# Patient Record
Sex: Male | Born: 2000 | Race: White | Hispanic: No | Marital: Single | Smoking: Never smoker
Health system: Southern US, Community
[De-identification: ages and names within clinical notes are randomized; demographics above are authoritative.]

## PROBLEM LIST (undated history)

## (undated) DIAGNOSIS — K219 Gastro-esophageal reflux disease without esophagitis: Secondary | ICD-10-CM

## (undated) DIAGNOSIS — T7840XA Allergy, unspecified, initial encounter: Secondary | ICD-10-CM

## (undated) DIAGNOSIS — R6250 Unspecified lack of expected normal physiological development in childhood: Secondary | ICD-10-CM

## (undated) DIAGNOSIS — B019 Varicella without complication: Secondary | ICD-10-CM

## (undated) DIAGNOSIS — E739 Lactose intolerance, unspecified: Secondary | ICD-10-CM

## (undated) HISTORY — DX: Gastro-esophageal reflux disease without esophagitis: K21.9

## (undated) HISTORY — DX: Allergy, unspecified, initial encounter: T78.40XA

## (undated) HISTORY — DX: Lactose intolerance, unspecified: E73.9

## (undated) HISTORY — DX: Unspecified lack of expected normal physiological development in childhood: R62.50

## (undated) HISTORY — DX: Varicella without complication: B01.9

---
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
## 2022-02-19 ENCOUNTER — Other Ambulatory Visit: Payer: Self-pay | Admitting: Family Medicine

## 2022-03-25 ENCOUNTER — Other Ambulatory Visit: Payer: Self-pay | Admitting: Family Medicine

## 2022-04-13 ENCOUNTER — Other Ambulatory Visit: Payer: Self-pay | Admitting: Family Medicine

## 2022-06-17 ENCOUNTER — Other Ambulatory Visit: Payer: Self-pay | Admitting: Family Medicine

## 2022-06-23 ENCOUNTER — Telehealth: Payer: Self-pay

## 2022-06-23 NOTE — Telephone Encounter (Signed)
Spoke with mother and advised her that if pt is out of medication to call office and we can send enough to make it to scheduled appt. Mom states that he said that he states he has enough for about 2 weeks.    [facility] Day - Client Nonclinical Telephone Record  [facility] Client [facility] Day - Client Client Site [facility] - Day Provider Claiborne Billings, [place] Contact Type Call Who Is Calling Patient / Member / Family / Caregiver Caller Name Chay Mazzoni Caller Phone Number [phone] Patient Name Jack Middleton Patient DOB May 21, 2001 Call Type Message Only Information Provided Reason for Call Request to Schedule Office Appointment Initial Comment Caller states son forgot prescription medication in campus, states has been out since may 2023, unsure if he needs new appt for refill/ Disp. Time Disposition Final User 06/17/2022 9:56:20 AM General Information Provided Yes Reina Fuse Call Closed By: Reina Fuse Transaction Date/Time: 06/17/2022 9:49:57 AM (ET)

## 2022-06-30 ENCOUNTER — Ambulatory Visit (INDEPENDENT_AMBULATORY_CARE_PROVIDER_SITE_OTHER): Payer: BC Managed Care – PPO | Admitting: Family Medicine

## 2022-06-30 ENCOUNTER — Encounter: Payer: Self-pay | Admitting: Family Medicine

## 2022-06-30 VITALS — BP 103/61 | HR 64 | Temp 97.5°F | Ht 67.0 in | Wt 123.0 lb

## 2022-06-30 DIAGNOSIS — F410 Panic disorder [episodic paroxysmal anxiety] without agoraphobia: Secondary | ICD-10-CM

## 2022-06-30 DIAGNOSIS — F419 Anxiety disorder, unspecified: Secondary | ICD-10-CM | POA: Diagnosis not present

## 2022-06-30 MED ORDER — ESCITALOPRAM OXALATE 10 MG PO TABS: 10.0000 mg | ORAL_TABLET | Freq: Every day | ORAL | 1 refills | Status: DC

## 2022-06-30 NOTE — Progress Notes (Signed)
Jack Middleton , 25-Aug-2001, 21 y.o., male MRN: [id number] Patient Care Team    Relationship Specialty Notifications Start End  Natalia Leatherwood, DO PCP - General Family Medicine  11/17/19     Chief Complaint  Patient presents with   Anxiety    [facility]; pt is not fasting     Subjective:  Jack Middleton is a 21 y.o. male presents today to discuss his anxiety.  Last visit lexapro 10 mg was started in place of zoloft. He did stop lexapro for awhile, but restarted about 6 weeks ago and feels it is working well again.   Prior note: He stopped taking zoloft on his own. He was tapered down to 25 mg qd, from 75 mg> which he felt made him numb> 50mg > which he eventually felt still made him numb> 25 mg which he did not feel was helpful enough. After he stopped meds he realized it was helping some at the 25 mg dose and attempted to restart at the full dose without tapering. He reports that made him sick and he vomited. He reports he has learned from all those mistakes and understands the importance to compliance with treatment. He would like to try another medication.   Prior note: Pt reports compliance with zoloft 75 mg qd initially- he reports he felt like it was causing him to not have any emotions. He tapered back to 1 tab a day and now he is taking 1/2 tab a a day (25 mg). He reports he is feeling well on this dose. He is going to college next month.  Prior note: with his mother to follow-up on anxiety.  He reports overall he is feels he is doing well on Zoloft 50 mg daily.  However he did notice since starting back to school this past week he has had an uptake in his anxiety. Prior note: He has started and tapered the Zoloft 50 mg daily.  He has tolerated the medication well.  He reports he feels much better since starting the medication.  He has noticed he is not as anxious and he is not worrying about things as much as he used to.  His mother reports she has also noticed a  considerable difference. Prior note:  Pt presents for an OV with complaints of anxiety. He is see today with his Mother which adds to history. He states he has noticed an  Increase in his anxiety over the last  Month since returning to in person classes. His grades are good. He does struggle some with Economics class, but overall grades are good. There has been no major changes in his classes or school, w/ the exception of pandemic changes. He reports feeling "overwhelmed", "worring about everything." He reports he gets about 8 hours of sleep a night, but has some issues falling asleep.  He and his mother state that he has had anxiety for about 10 years.  His sister also has anxiety and is on medication.  Mother states he is very hard on himself and wants to have good grades and stresses over school.  He is starting to go on college towards. Patient denies flushing, palpitations, diarrhea or unintentional weight loss     06/30/2022    1:51 PM 08/05/2021    2:40 PM 04/25/2021    3:52 PM 11/19/2020    3:47 PM 02/11/2020    3:30 PM  Depression screen PHQ 2/9  Decreased Interest 0 0 1 0 1  Down, Depressed, Hopeless 0 0 0 0 1  PHQ - 2 Score 0 0 1 0 2  Altered sleeping 0 2 0 0 3  Tired, decreased energy 1 0 2 3 3   Change in appetite 0 0 1 0 3  Feeling bad or failure about yourself  0 0 0 0 3  Trouble concentrating 0 1 0 0 3  Moving slowly or fidgety/restless 0 0 0 0 3  Suicidal thoughts 0 0 0 0 0  PHQ-9 Score 1 3 4 3 20   Difficult doing work/chores     Not difficult at all      06/30/2022    1:51 PM 08/05/2021    2:41 PM 04/25/2021    3:52 PM 11/19/2020    3:47 PM  GAD 7 : Generalized Anxiety Score  Nervous, Anxious, on Edge 0 3 0 1  Control/stop worrying 0 3 0 1  Worry too much - different things 0 3 0 0  Trouble relaxing 0 0 0 0  Restless 0 0 0 0  Easily annoyed or irritable 0 0 1 0  Afraid - awful might happen 0 0 0 0  Total GAD 7 Score 0 9 1 2     Allergies  Allergen Reactions    Dairy Aid [Tilactase]    Social History   Social History Narrative   Work or School: 06/26/2021 Situation: lives with mom, dad, sister (3 years older)   Safety:      -smoke alarm in the home:Yes     - wears seatbelt: Yes         Past Medical History:  Diagnosis Date   Allergy    Asthma 2016   Had been on Qvar, Singulair, regular and Flonase   Central auditory processing disorder 2014   [facility]   Chicken pox    Chronic gastritis without bleeding 10/07/2015   Concussion 2016   Dental decay 05/27/2015   GERD (gastroesophageal reflux disease) 05/15/2016   Lactose intolerance    Osgood-Schlatter's disease of both knees 06/11/2017   Physical growth delay 05/15/2016   - sees endocrinologist at [facility].  Had been prescribed testosterone at one time.   History reviewed. No pertinent surgical history. Family History  Problem Relation Age of Onset   Hypothyroidism Mother    Hyperlipidemia Father    Hypothyroidism Sister    Hypertension Maternal Grandfather    Heart attack Maternal Grandfather    Heart attack Paternal Grandmother    Heart disease Paternal Grandmother    Heart attack Paternal Grandfather    Heart disease Paternal Grandfather    Hyperlipidemia Paternal Grandfather    Allergies as of 06/30/2022       Reactions   Dairy Aid [tilactase]         Medication List        Accurate as of June 30, 2022  2:02 PM. If you have any questions, ask your nurse or doctor.          cholecalciferol 25 MCG (1000 UNIT) tablet Commonly known as: VITAMIN D3 Take 1,000 Units by mouth daily.   escitalopram 10 MG tablet Commonly known as: LEXAPRO Take 1 tablet (10 mg total) by mouth daily.   fexofenadine 180 MG tablet Commonly known as: ALLEGRA Take 180 mg by mouth daily.   vitamin C 1000 MG tablet Take 1,000 mg by mouth daily.        All past medical history, surgical history, allergies,  family history, immunizations andmedications were updated in the EMR today and reviewed under the history and medication portions of their EMR.     ROS: Negative, with the exception of above mentioned in HPI   Objective:  BP 103/61   Pulse 64   Temp (!) 97.5 F (36.4 C) (Oral)   Ht 5\' 7"  (1.702 m)   Wt 123 lb (55.8 kg)   SpO2 97%   BMI 19.26 kg/m  Body mass index is 19.26 kg/m. Physical Exam Vitals and nursing note reviewed.  Constitutional:      General: He is not in acute distress.    Appearance: Normal appearance. He is not ill-appearing, toxic-appearing or diaphoretic.  HENT:     Head: Normocephalic and atraumatic.  Eyes:     General: No scleral icterus.       Right eye: No discharge.        Left eye: No discharge.     Extraocular Movements: Extraocular movements intact.     Pupils: Pupils are equal, round, and reactive to light.  Skin:    General: Skin is warm and dry.     Coloration: Skin is not jaundiced or pale.     Findings: No rash.  Neurological:     Mental Status: He is alert and oriented to person, place, and time. Mental status is at baseline.  Psychiatric:        Mood and Affect: Mood normal.        Behavior: Behavior normal.        Thought Content: Thought content normal.        Judgment: Judgment normal.      No results found. No results found. No results found for this or any previous visit (from the past 24 hour(s)).  Assessment/Plan: Jack Middleton is a 21 y.o. male present for OV for  GAD/panic: stable Continue lexapro 10 mg.  - Ambulatory referral to Psychology was placed at initial visit - f/u 5.5 mos on chronic conditions, sooner if needed   Reviewed expectations re: course of current medical issues. Discussed self-management of symptoms. Outlined signs and symptoms indicating need for more acute intervention. Patient verbalized understanding and all questions were answered. Patient received an After-Visit Summary.    No  orders of the defined types were placed in this encounter.   Meds ordered this encounter  Medications   escitalopram (LEXAPRO) 10 MG tablet    Sig: Take 1 tablet (10 mg total) by mouth daily.    Dispense:  90 tablet    Refill:  1     Referral Orders  No referral(s) requested today     Note is dictated utilizing voice recognition software. Although note has been proof read prior to signing, occasional typographical errors still can be missed. If any questions arise, please do not hesitate to call for verification.   electronically signed by:  12, DO  [facility] [place]

## 2022-06-30 NOTE — Patient Instructions (Addendum)
Return in about 24 weeks (around 12/15/2022) for Routine chronic condition follow-up.        Great to see you today.  I have refilled the medication(s) we provide.   If labs were collected, we will inform you of lab results once received either by echart message or telephone call.   - echart message- for normal results that have been seen by the patient already.   - telephone call: abnormal results or if patient has not viewed results in their echart.

## 2022-11-29 DIAGNOSIS — F4322 Adjustment disorder with anxiety: Secondary | ICD-10-CM | POA: Diagnosis not present

## 2022-12-06 DIAGNOSIS — F4322 Adjustment disorder with anxiety: Secondary | ICD-10-CM | POA: Diagnosis not present

## 2022-12-08 DIAGNOSIS — F4322 Adjustment disorder with anxiety: Secondary | ICD-10-CM | POA: Diagnosis not present

## 2022-12-13 DIAGNOSIS — F4322 Adjustment disorder with anxiety: Secondary | ICD-10-CM | POA: Diagnosis not present

## 2022-12-15 ENCOUNTER — Ambulatory Visit: Payer: BC Managed Care – PPO | Admitting: Family Medicine

## 2022-12-20 DIAGNOSIS — F4322 Adjustment disorder with anxiety: Secondary | ICD-10-CM | POA: Diagnosis not present

## 2022-12-26 ENCOUNTER — Ambulatory Visit (INDEPENDENT_AMBULATORY_CARE_PROVIDER_SITE_OTHER): Payer: BC Managed Care – PPO | Admitting: Family Medicine

## 2022-12-26 ENCOUNTER — Encounter: Payer: Self-pay | Admitting: Family Medicine

## 2022-12-26 VITALS — BP 111/70 | HR 43 | Wt 127.4 lb

## 2022-12-26 DIAGNOSIS — F419 Anxiety disorder, unspecified: Secondary | ICD-10-CM | POA: Diagnosis not present

## 2022-12-26 DIAGNOSIS — F410 Panic disorder [episodic paroxysmal anxiety] without agoraphobia: Secondary | ICD-10-CM

## 2022-12-26 MED ORDER — ESCITALOPRAM OXALATE 20 MG PO TABS: 20.0000 mg | ORAL_TABLET | Freq: Every day | ORAL | 1 refills | Status: DC

## 2022-12-26 NOTE — Patient Instructions (Addendum)
Return in about 24 weeks (around 06/12/2023) for Routine chronic condition follow-up.        Great to see you today.  I have refilled the medication(s) we provide.   If labs were collected, we will inform you of lab results once received either by echart message or telephone call.   - echart message- for normal results that have been seen by the patient already.   - telephone call: abnormal results or if patient has not viewed results in their echart.

## 2022-12-26 NOTE — Progress Notes (Signed)
Jack Middleton , Jun 05, 2001, 22 y.o., male MRN: [id number] Patient Care Team    Relationship Specialty Notifications Start End  Ma Hillock, DO PCP - General Family Medicine  11/17/19     Chief Complaint  Patient presents with   Anxiety    [facility]     Subjective:  Jack Middleton is a 22 y.o. male presents today to discuss his anxiety.  He reports tolerance of lexapro 10 mg qd. He is still having a good bit of anxiety. He has been exercising and training for marathon.    Prior note: He stopped taking zoloft on his own. He was tapered down to 25 mg qd, from 75 mg> which he felt made him numb> '50mg'$ > which he eventually felt still made him numb> 25 mg which he did not feel was helpful enough. After he stopped meds he realized it was helping some at the 25 mg dose and attempted to restart at the full dose without tapering. He reports that made him sick and he vomited. He reports he has learned from all those mistakes and understands the importance to compliance with treatment. He would like to try another medication.   Prior note: Pt reports compliance with zoloft 75 mg qd initially- he reports he felt like it was causing him to not have any emotions. He tapered back to 1 tab a day and now he is taking 1/2 tab a a day (25 mg). He reports he is feeling well on this dose. He is going to college next month.  Prior note: with his mother to follow-up on anxiety.  He reports overall he is feels he is doing well on Zoloft 50 mg daily.  However he did notice since starting back to school this past week he has had an uptake in his anxiety. Prior note: He has started and tapered the Zoloft 50 mg daily.  He has tolerated the medication well.  He reports he feels much better since starting the medication.  He has noticed he is not as anxious and he is not worrying about things as much as he used to.  His mother reports she has also noticed a considerable difference. Prior note:  Pt presents  for an OV with complaints of anxiety. He is see today with his Mother which adds to history. He states he has noticed an  Increase in his anxiety over the last  Month since returning to in person classes. His grades are good. He does struggle some with Economics class, but overall grades are good. There has been no major changes in his classes or school, w/ the exception of pandemic changes. He reports feeling "overwhelmed", "worring about everything." He reports he gets about 8 hours of sleep a night, but has some issues falling asleep.  He and his mother state that he has had anxiety for about 10 years.  His sister also has anxiety and is on medication.  Mother states he is very hard on himself and wants to have good grades and stresses over school.  He is starting to go on college towards. Patient denies flushing, palpitations, diarrhea or unintentional weight loss     12/26/2022   10:02 AM 06/30/2022    1:51 PM 08/05/2021    2:40 PM 04/25/2021    3:52 PM 11/19/2020    3:47 PM  Depression screen PHQ 2/9  Decreased Interest 0 0 0 1 0  Down, Depressed, Hopeless 0 0 0 0 0  PHQ - 2 Score 0 0 0 1 0  Altered sleeping  0 2 0 0  Tired, decreased energy  1 0 2 3  Change in appetite  0 0 1 0  Feeling bad or failure about yourself   0 0 0 0  Trouble concentrating  0 1 0 0  Moving slowly or fidgety/restless  0 0 0 0  Suicidal thoughts  0 0 0 0  PHQ-9 Score  '1 3 4 3      '$ 12/26/2022   10:02 AM 06/30/2022    1:51 PM 08/05/2021    2:41 PM 04/25/2021    3:52 PM  GAD 7 : Generalized Anxiety Score  Nervous, Anxious, on Edge 0 0 3 0  Control/stop worrying 0 0 3 0  Worry too much - different things 0 0 3 0  Trouble relaxing 0 0 0 0  Restless 0 0 0 0  Easily annoyed or irritable 0 0 0 1  Afraid - awful might happen 0 0 0 0  Total GAD 7 Score 0 0 9 1    Allergies  Allergen Reactions   Dairy Aid [Tilactase]    Social History   Social History Narrative   Work or School: Scientist, product/process development Situation: lives with mom, dad, sister (72 years older)   Safety:      -smoke alarm in the home:Yes     - wears seatbelt: Yes         Past Medical History:  Diagnosis Date   Allergy    Asthma 2016   Had been on Qvar, Singulair, regular and Flonase   Central auditory processing disorder 2014   Diagnosed by [facility]   Chicken pox    Chronic gastritis without bleeding 10/07/2015   Concussion 2016   Dental decay 05/27/2015   GERD (gastroesophageal reflux disease) 05/15/2016   Lactose intolerance    Osgood-Schlatter's disease of both knees 06/11/2017   Physical growth delay 05/15/2016   - sees endocrinologist at [facility].  Had been prescribed testosterone at one time.   History reviewed. No pertinent surgical history. Family History  Problem Relation Age of Onset   Hypothyroidism Mother    Hyperlipidemia Father    Hypothyroidism Sister    Hypertension Maternal Grandfather    Heart attack Maternal Grandfather    Heart attack Paternal Grandmother    Heart disease Paternal Grandmother    Heart attack Paternal Grandfather    Heart disease Paternal Grandfather    Hyperlipidemia Paternal Grandfather    Allergies as of 12/26/2022       Reactions   Dairy Aid [tilactase]         Medication List        Accurate as of December 26, 2022 10:15 AM. If you have any questions, ask your nurse or doctor.          cholecalciferol 25 MCG (1000 UNIT) tablet Commonly known as: VITAMIN D3 Take 1,000 Units by mouth daily.   escitalopram 20 MG tablet Commonly known as: LEXAPRO Take 1 tablet (20 mg total) by mouth daily. What changed:  medication strength how much to take Changed by: Howard Pouch, DO   fexofenadine 180 MG tablet Commonly known as: ALLEGRA Take 180 mg by mouth daily.   vitamin C 1000 MG tablet Take 1,000 mg by mouth daily.        All past medical history, surgical history, allergies, family history, immunizations  andmedications were updated in the  EMR today and reviewed under the history and medication portions of their EMR.     ROS: Negative, with the exception of above mentioned in HPI   Objective:  BP 111/70   Pulse (!) 43   Wt 127 lb 6.4 oz (57.8 kg)   SpO2 100%   BMI 19.95 kg/m  Body mass index is 19.95 kg/m. Physical Exam Vitals and nursing note reviewed.  Constitutional:      General: He is not in acute distress.    Appearance: Normal appearance. He is not ill-appearing, toxic-appearing or diaphoretic.  HENT:     Head: Normocephalic and atraumatic.  Eyes:     General: No scleral icterus.       Right eye: No discharge.        Left eye: No discharge.     Extraocular Movements: Extraocular movements intact.     Pupils: Pupils are equal, round, and reactive to light.  Skin:    General: Skin is warm and dry.     Coloration: Skin is not jaundiced or pale.     Findings: No rash.  Neurological:     Mental Status: He is alert and oriented to person, place, and time. Mental status is at baseline.  Psychiatric:        Mood and Affect: Mood normal.        Behavior: Behavior normal.        Thought Content: Thought content normal.        Judgment: Judgment normal.      No results found. No results found. No results found for this or any previous visit (from the past 24 hour(s)).  Assessment/Plan: Jack Middleton is a 22 y.o. male present for OV for  GAD/panic: Increased anxiety Increase lexapro 10 mg.> 20 mg  - He is now seeing a therapist routinely and feels it is a good fit.  - f/u 5.5 mos on chronic conditions, sooner if needed   Reviewed expectations re: course of current medical issues. Discussed self-management of symptoms. Outlined signs and symptoms indicating need for more acute intervention. Patient verbalized understanding and all questions were answered. Patient received an After-Visit Summary.    No orders of the defined types were placed in this  encounter.   Meds ordered this encounter  Medications   escitalopram (LEXAPRO) 20 MG tablet    Sig: Take 1 tablet (20 mg total) by mouth daily.    Dispense:  90 tablet    Refill:  1     Referral Orders  No referral(s) requested today     Note is dictated utilizing voice recognition software. Although note has been proof read prior to signing, occasional typographical errors still can be missed. If any questions arise, please do not hesitate to call for verification.   electronically signed by:  Howard Pouch, DO  [place]

## 2022-12-27 DIAGNOSIS — F4322 Adjustment disorder with anxiety: Secondary | ICD-10-CM | POA: Diagnosis not present

## 2023-01-03 DIAGNOSIS — F4322 Adjustment disorder with anxiety: Secondary | ICD-10-CM | POA: Diagnosis not present

## 2023-01-10 DIAGNOSIS — F4322 Adjustment disorder with anxiety: Secondary | ICD-10-CM | POA: Diagnosis not present

## 2023-01-17 DIAGNOSIS — F4322 Adjustment disorder with anxiety: Secondary | ICD-10-CM | POA: Diagnosis not present

## 2023-01-24 DIAGNOSIS — F4322 Adjustment disorder with anxiety: Secondary | ICD-10-CM | POA: Diagnosis not present

## 2023-02-09 DIAGNOSIS — F4322 Adjustment disorder with anxiety: Secondary | ICD-10-CM | POA: Diagnosis not present

## 2023-03-01 DIAGNOSIS — F4322 Adjustment disorder with anxiety: Secondary | ICD-10-CM | POA: Diagnosis not present

## 2023-03-21 DIAGNOSIS — F4322 Adjustment disorder with anxiety: Secondary | ICD-10-CM | POA: Diagnosis not present

## 2023-04-06 DIAGNOSIS — F4322 Adjustment disorder with anxiety: Secondary | ICD-10-CM | POA: Diagnosis not present

## 2023-05-03 DIAGNOSIS — F4322 Adjustment disorder with anxiety: Secondary | ICD-10-CM | POA: Diagnosis not present

## 2023-06-06 DIAGNOSIS — F4322 Adjustment disorder with anxiety: Secondary | ICD-10-CM | POA: Diagnosis not present

## 2023-06-12 ENCOUNTER — Telehealth: Payer: BC Managed Care – PPO | Admitting: Family Medicine

## 2023-06-15 ENCOUNTER — Telehealth: Payer: BC Managed Care – PPO | Admitting: Family Medicine

## 2023-06-24 ENCOUNTER — Other Ambulatory Visit: Payer: Self-pay | Admitting: Family Medicine

## 2023-06-29 ENCOUNTER — Encounter: Payer: Self-pay | Admitting: Family Medicine

## 2023-06-29 ENCOUNTER — Telehealth (INDEPENDENT_AMBULATORY_CARE_PROVIDER_SITE_OTHER): Payer: BC Managed Care – PPO | Admitting: Family Medicine

## 2023-06-29 DIAGNOSIS — F419 Anxiety disorder, unspecified: Secondary | ICD-10-CM | POA: Diagnosis not present

## 2023-06-29 DIAGNOSIS — F410 Panic disorder [episodic paroxysmal anxiety] without agoraphobia: Secondary | ICD-10-CM | POA: Diagnosis not present

## 2023-06-29 DIAGNOSIS — F4322 Adjustment disorder with anxiety: Secondary | ICD-10-CM | POA: Diagnosis not present

## 2023-06-29 MED ORDER — ESCITALOPRAM OXALATE 20 MG PO TABS: 20.0000 mg | ORAL_TABLET | Freq: Every day | ORAL | 1 refills | Status: DC

## 2023-06-29 NOTE — Progress Notes (Signed)
VIRTUAL VISIT VIA VIDEO  I connected with Jack Middleton on 06/29/23 at  2:40 PM EDT by a video enabled telemedicine application and verified that I am speaking with the correct person using two identifiers. Location patient: Home Location provider: [facility], Office Persons participating in the virtual visit: Patient, Dr. Claiborne Billings and Ivonne Andrew, CMA  I discussed the limitations of evaluation and management by telemedicine and the availability of in person appointments. The patient expressed understanding and agreed to proceed.      Jack Middleton , 09/03/2001, 22 y.o., male MRN: [id number] Patient Care Team    Relationship Specialty Notifications Start End  Natalia Leatherwood, DO PCP - General Family Medicine  11/17/19     Chief Complaint  Patient presents with   Medical Management of Chronic Issues     Subjective:  Jack Middleton is a 22 y.o. male present Chronic Conditions/illness Management today  Anxiety/panic: He reports compliance with  lexapro 20 mg every day, which was increased last visit. He reports he feels well on current dose. He has noticed a decrease in libido since increasing the dose.   Prior note: He stopped taking zoloft on his own. He was tapered down to 25 mg qd, from 75 mg> which he felt made him numb> 50mg > which he eventually felt still made him numb> 25 mg which he did not feel was helpful enough. After he stopped meds he realized it was helping some at the 25 mg dose and attempted to restart at the full dose without tapering. He reports that made him sick and he vomited. He reports he has learned from all those mistakes and understands the importance to compliance with treatment. He would like to try another medication.   Prior note: Pt reports compliance with zoloft 75 mg qd initially- he reports he felt like it was causing him to not have any emotions. He tapered back to 1 tab a day and now he is taking 1/2 tab a a day (25 mg). He  reports he is feeling well on this dose. He is going to college next month.  Prior note: with his mother to follow-up on anxiety.  He reports overall he is feels he is doing well on Zoloft 50 mg daily.  However he did notice since starting back to school this past week he has had an uptake in his anxiety. Prior note: He has started and tapered the Zoloft 50 mg daily.  He has tolerated the medication well.  He reports he feels much better since starting the medication.  He has noticed he is not as anxious and he is not worrying about things as much as he used to.  His mother reports she has also noticed a considerable difference. Prior note:  Pt presents for an OV with complaints of anxiety. He is see today with his Mother which adds to history. He states he has noticed an  Increase in his anxiety over the last  Month since returning to in person classes. His grades are good. He does struggle some with Economics class, but overall grades are good. There has been no major changes in his classes or school, w/ the exception of pandemic changes. He reports feeling "overwhelmed", "worring about everything." He reports he gets about 8 hours of sleep a night, but has some issues falling asleep.  He and his mother state that he has had anxiety for about 10 years.  His sister also has  anxiety and is on medication.  Mother states he is very hard on himself and wants to have good grades and stresses over school.  He is starting to go on college towards. Patient denies flushing, palpitations, diarrhea or unintentional weight loss     06/29/2023    2:04 PM 12/26/2022   10:02 AM 06/30/2022    1:51 PM 08/05/2021    2:40 PM 04/25/2021    3:52 PM  Depression screen PHQ 2/9  Decreased Interest 0 0 0 0 1  Down, Depressed, Hopeless 1 0 0 0 0  PHQ - 2 Score 1 0 0 0 1  Altered sleeping 0  0 2 0  Tired, decreased energy 1  1 0 2  Change in appetite 0  0 0 1  Feeling bad or failure about yourself  0  0 0 0  Trouble  concentrating 0  0 1 0  Moving slowly or fidgety/restless 0  0 0 0  Suicidal thoughts 0  0 0 0  PHQ-9 Score 2  1 3 4   Difficult doing work/chores Not difficult at all          06/29/2023    2:05 PM 12/26/2022   10:02 AM 06/30/2022    1:51 PM 08/05/2021    2:41 PM  GAD 7 : Generalized Anxiety Score  Nervous, Anxious, on Edge 1 0 0 3  Control/stop worrying 1 0 0 3  Worry too much - different things 1 0 0 3  Trouble relaxing 0 0 0 0  Restless 0 0 0 0  Easily annoyed or irritable 0 0 0 0  Afraid - awful might happen 0 0 0 0  Total GAD 7 Score 3 0 0 9  Anxiety Difficulty Not difficult at all       Allergies  Allergen Reactions   Dairy Aid [Tilactase]    Social History   Social History Narrative   Work or School: Sports coach Situation: lives with mom, dad, sister (3 years older)   Safety:      -smoke alarm in the home:Yes     - wears seatbelt: Yes         Past Medical History:  Diagnosis Date   Allergy    Asthma 2016   Had been on Qvar, Singulair, regular and Flonase   Central auditory processing disorder 2014   [facility]   Chicken pox    Chronic gastritis without bleeding 10/07/2015   Concussion 2016   Dental decay 05/27/2015   GERD (gastroesophageal reflux disease) 05/15/2016   Lactose intolerance    Osgood-Schlatter's disease of both knees 06/11/2017   Physical growth delay 05/15/2016   - sees endocrinologist at [facility].  Had been prescribed testosterone at one time.   History reviewed. No pertinent surgical history. Family History  Problem Relation Age of Onset   Hypothyroidism Mother    Hyperlipidemia Father    Hypothyroidism Sister    Hypertension Maternal Grandfather    Heart attack Maternal Grandfather    Heart attack Paternal Grandmother    Heart disease Paternal Grandmother    Heart attack Paternal Grandfather    Heart disease Paternal Grandfather    Hyperlipidemia Paternal  Grandfather    Allergies as of 06/29/2023       Reactions   Dairy Aid [tilactase]         Medication List        Accurate as of June 29, 2023  2:08  PM. If you have any questions, ask your nurse or doctor.          cholecalciferol 25 MCG (1000 UNIT) tablet Commonly known as: VITAMIN D3 Take 1,000 Units by mouth daily.   escitalopram 20 MG tablet Commonly known as: LEXAPRO Take 1 tablet (20 mg total) by mouth daily.   fexofenadine 180 MG tablet Commonly known as: ALLEGRA Take 180 mg by mouth daily.   vitamin C 1000 MG tablet Take 1,000 mg by mouth daily.        All past medical history, surgical history, allergies, family history, immunizations andmedications were updated in the EMR today and reviewed under the history and medication portions of their EMR.     ROS: Negative, with the exception of above mentioned in HPI   Objective:  There were no vitals taken for this visit. There is no height or weight on file to calculate BMI. Physical Exam Vitals and nursing note reviewed.  Constitutional:      General: He is not in acute distress.    Appearance: Normal appearance. He is not toxic-appearing.  HENT:     Head: Normocephalic and atraumatic.  Eyes:     General: No scleral icterus.       Right eye: No discharge.        Left eye: No discharge.     Conjunctiva/sclera: Conjunctivae normal.  Pulmonary:     Effort: Pulmonary effort is normal.  Musculoskeletal:     Cervical back: Normal range of motion.  Skin:    Findings: No rash.  Neurological:     Mental Status: He is alert and oriented to person, place, and time. Mental status is at baseline.  Psychiatric:        Mood and Affect: Mood normal.        Behavior: Behavior normal.        Thought Content: Thought content normal.        Judgment: Judgment normal.    No results found. No results found. No results found for this or any previous visit (from the past 24  hour(s)).  Assessment/Plan: Bikram Zubal is a 22 y.o. male present for OV for  GAD/panic: Stable. He is endorsing decrease in libido, but would like to stick with current dose for now. We also discussed Wellbutrin if this SE becomes an issue for him. Hopefully SE will decrease with time.  - lexapro 20 mg  - He is now seeing a therapist routinely and feels it is a good fit.  - f/u 5.5 mos on chronic conditions sooner if needed   Reviewed expectations re: course of current medical issues. Discussed self-management of symptoms. Outlined signs and symptoms indicating need for more acute intervention. Patient verbalized understanding and all questions were answered. Patient received an After-Visit Summary.    No orders of the defined types were placed in this encounter.   No orders of the defined types were placed in this encounter.    Referral Orders  No referral(s) requested today     Note is dictated utilizing voice recognition software. Although note has been proof read prior to signing, occasional typographical errors still can be missed. If any questions arise, please do not hesitate to call for verification.   electronically signed by:  Felix Pacini, DO  [facility] [place]

## 2023-06-29 NOTE — Patient Instructions (Signed)

## 2023-12-28 ENCOUNTER — Other Ambulatory Visit: Payer: Self-pay | Admitting: Family Medicine

## 2024-01-31 ENCOUNTER — Other Ambulatory Visit: Payer: Self-pay | Admitting: Family Medicine

## 2024-02-03 ENCOUNTER — Other Ambulatory Visit: Payer: Self-pay | Admitting: Family Medicine

## 2024-03-18 ENCOUNTER — Ambulatory Visit (INDEPENDENT_AMBULATORY_CARE_PROVIDER_SITE_OTHER): Admitting: Family Medicine

## 2024-03-18 DIAGNOSIS — Z91199 Patient's noncompliance with other medical treatment and regimen due to unspecified reason: Secondary | ICD-10-CM

## 2024-03-18 NOTE — Progress Notes (Signed)
    No show/cancel after appt time

## 2024-03-20 ENCOUNTER — Telehealth (INDEPENDENT_AMBULATORY_CARE_PROVIDER_SITE_OTHER): Admitting: Family Medicine

## 2024-03-20 ENCOUNTER — Encounter: Payer: Self-pay | Admitting: Family Medicine

## 2024-03-20 DIAGNOSIS — F419 Anxiety disorder, unspecified: Secondary | ICD-10-CM

## 2024-03-20 DIAGNOSIS — F410 Panic disorder [episodic paroxysmal anxiety] without agoraphobia: Secondary | ICD-10-CM | POA: Diagnosis not present

## 2024-03-20 MED ORDER — SERTRALINE HCL 25 MG PO TABS: 25.0000 mg | ORAL_TABLET | Freq: Every day | ORAL | 3 refills | Status: AC

## 2024-03-20 NOTE — Progress Notes (Signed)
 VIRTUAL VISIT VIA VIDEO  I connected with Jack Middleton on 03/20/24 at  9:20 AM EDT by a video enabled telemedicine application and verified that I am speaking with the correct person using two identifiers. Location patient: Home Location provider: [facility], Office Persons participating in the virtual visit: Patient, Dr. Marylee Snowball and Jack Middleton, CMA  I discussed the limitations of evaluation and management by telemedicine and the availability of in person appointments. The patient expressed understanding and agreed to proceed.     Jack Middleton , Nov 04, 2000, 23 y.o., male MRN: [id number] Patient Care Team    Relationship Specialty Notifications Start End  Mariel Shope, DO PCP - General Family Medicine  11/17/19     Chief Complaint  Patient presents with   Anxiety    Pt wants to discuss getting off of Lexapro  completely. Pt has not been taking med.      Subjective:  Jack Middleton is a 23 y.o. male present Chronic Conditions/illness Management today  Anxiety/panic: He reports he stopped the lexapro  20 mg every day. He felt the side effects became too much. He experienced decreased libido, and then went on to developing nausea and diarrhea. He is moving to [place]  in about a month. He graduated this month. He would like to restart a medication for anxiety.   Prior note: He stopped taking zoloft  on his own. He was tapered down to 25 mg qd, from 75 mg> which he felt made him numb> 50mg > which he eventually felt still made him numb> 25 mg which he did not feel was helpful enough. After he stopped meds he realized it was helping some at the 25 mg dose and attempted to restart at the full dose without tapering. He reports that made him sick and he vomited. He reports he has learned from all those mistakes and understands the importance to compliance with treatment. He would like to try another medication.   Prior note: Pt reports compliance with zoloft  75 mg qd  initially- he reports he felt like it was causing him to not have any emotions. He tapered back to 1 tab a day and now he is taking 1/2 tab a a day (25 mg). He reports he is feeling well on this dose. He is going to college next month.  Prior note: with his mother to follow-up on anxiety.  He reports overall he is feels he is doing well on Zoloft  50 mg daily.  However he did notice since starting back to school this past week he has had an uptake in his anxiety. Prior note: He has started and tapered the Zoloft  50 mg daily.  He has tolerated the medication well.  He reports he feels much better since starting the medication.  He has noticed he is not as anxious and he is not worrying about things as much as he used to.  His mother reports she has also noticed a considerable difference. Prior note:  Pt presents for an OV with complaints of anxiety. He is see today with his Mother which adds to history. He states he has noticed an  Increase in his anxiety over the last  Month since returning to in person classes. His grades are good. He does struggle some with Economics class, but overall grades are good. There has been no major changes in his classes or school, w/ the exception of pandemic changes. He reports feeling "overwhelmed", "worring about everything." He reports he gets  about 8 hours of sleep a night, but has some issues falling asleep.  He and his mother state that he has had anxiety for about 10 years.  His sister also has anxiety and is on medication.  Mother states he is very hard on himself and wants to have good grades and stresses over school.  He is starting to go on college towards. Patient denies flushing, palpitations, diarrhea or unintentional weight loss     03/20/2024    8:58 AM 06/29/2023    2:04 PM 12/26/2022   10:02 AM 06/30/2022    1:51 PM 08/05/2021    2:40 PM  Depression screen PHQ 2/9  Decreased Interest 0 0 0 0 0  Down, Depressed, Hopeless 0 1 0 0 0  PHQ - 2 Score 0 1 0 0 0   Altered sleeping  0  0 2  Tired, decreased energy  1  1 0  Change in appetite  0  0 0  Feeling bad or failure about yourself   0  0 0  Trouble concentrating  0  0 1  Moving slowly or fidgety/restless  0  0 0  Suicidal thoughts  0  0 0  PHQ-9 Score  2  1 3   Difficult doing work/chores  Not difficult at all         06/29/2023    2:05 PM 12/26/2022   10:02 AM 06/30/2022    1:51 PM 08/05/2021    2:41 PM  GAD 7 : Generalized Anxiety Score  Nervous, Anxious, on Edge 1 0 0 3  Control/stop worrying 1 0 0 3  Worry too much - different things 1 0 0 3  Trouble relaxing 0 0 0 0  Restless 0 0 0 0  Easily annoyed or irritable 0 0 0 0  Afraid - awful might happen 0 0 0 0  Total GAD 7 Score 3 0 0 9  Anxiety Difficulty Not difficult at all       Allergies  Allergen Reactions   Dairy Aid [Tilactase]    Social History   Social History Narrative   Work or School: Sports coach Situation: lives with mom, dad, sister (3 years older)   Safety:      -smoke alarm in the home:Yes     - wears seatbelt: Yes         Past Medical History:  Diagnosis Date   Allergy    Asthma 2016   Had been on Qvar, Singulair, regular and Flonase   Central auditory processing disorder 2014   [facility]   Chicken pox    Chronic gastritis without bleeding 10/07/2015   Concussion 2016   Dental decay 05/27/2015   GERD (gastroesophageal reflux disease) 05/15/2016   Lactose intolerance    Osgood-Schlatter's disease of both knees 06/11/2017   Physical growth delay 05/15/2016   - sees endocrinologist at [facility].  Had been prescribed testosterone  at one time.   History reviewed. No pertinent surgical history. Family History  Problem Relation Age of Onset   Hypothyroidism Mother    Hyperlipidemia Father    Hypothyroidism Sister    Hypertension Maternal Grandfather    Heart attack Maternal Grandfather    Heart attack Paternal Grandmother    Heart  disease Paternal Grandmother    Heart attack Paternal Grandfather    Heart disease Paternal Grandfather    Hyperlipidemia Paternal Grandfather    Allergies as of 03/20/2024  Reactions   Dairy Aid [tilactase]         Medication List        Accurate as of March 20, 2024  9:17 AM. If you have any questions, ask your nurse or doctor.          cholecalciferol 25 MCG (1000 UNIT) tablet Commonly known as: VITAMIN D3 Take 1,000 Units by mouth daily.   escitalopram  20 MG tablet Commonly known as: LEXAPRO  TAKE 1 TABLET BY MOUTH EVERY DAY   fexofenadine 180 MG tablet Commonly known as: ALLEGRA Take 180 mg by mouth daily.   sertraline  25 MG tablet Commonly known as: ZOLOFT  Take 1-1.5 tablets (25-37.5 mg total) by mouth daily. Started by: Jack Middleton   vitamin C 1000 MG tablet Take 1,000 mg by mouth daily.        All past medical history, surgical history, allergies, family history, immunizations andmedications were updated in the EMR today and reviewed under the history and medication portions of their EMR.     ROS: Negative, with the exception of above mentioned in HPI   Objective:  There were no vitals taken for this visit. There is no height or weight on file to calculate BMI. Physical Exam Vitals and nursing note reviewed.  Constitutional:      General: He is not in acute distress.    Appearance: Normal appearance. He is not toxic-appearing.  HENT:     Head: Normocephalic and atraumatic.  Eyes:     General: No scleral icterus.       Right eye: No discharge.        Left eye: No discharge.     Conjunctiva/sclera: Conjunctivae normal.  Pulmonary:     Effort: Pulmonary effort is normal.  Musculoskeletal:     Cervical back: Normal range of motion.  Skin:    Findings: No rash.  Neurological:     Mental Status: He is alert and oriented to person, place, and time. Mental status is at baseline.  Psychiatric:        Mood and Affect: Mood normal.         Behavior: Behavior normal.        Thought Content: Thought content normal.        Judgment: Judgment normal.    No results found. No results found. No results found for this or any previous visit (from the past 24 hours).  Assessment/Plan: Jack Middleton is a 23 y.o. male present for OV for  GAD/panic: - we discussed his prior complaints with the zoloft  in detail. He did not experience negative side effects, but felt dosage was too much or too little. He would like to try this medication again.  - start zoloft  25 mg every day, may taper to 37.5 mg every day if needed.  - He is now seeing a therapist routinely and feels it is a good fit.  - He is moving to [place]  next month and will est with pcp there.    Reviewed expectations re: course of current medical issues. Discussed self-management of symptoms. Outlined signs and symptoms indicating need for more acute intervention. Patient verbalized understanding and all questions were answered. Patient received an After-Visit Summary.    No orders of the defined types were placed in this encounter.   Meds ordered this encounter  Medications   sertraline  (ZOLOFT ) 25 MG tablet    Sig: Take 1-1.5 tablets (25-37.5 mg total) by mouth daily.    Dispense:  135 tablet  Refill:  3     Referral Orders  No referral(s) requested today     Note is dictated utilizing voice recognition software. Although note has been proof read prior to signing, occasional typographical errors still can be missed. If any questions arise, please do not hesitate to call for verification.   electronically signed by:  Jack Backbone, DO  [facility] [place]

## 2024-03-20 NOTE — Patient Instructions (Addendum)
